# Patient Record
Sex: Female | Born: 1965 | Race: White | Hispanic: No | Marital: Married | Smoking: Never smoker
Health system: Western US, Academic
[De-identification: ages and names within clinical notes are randomized; demographics above are authoritative.]

## PROBLEM LIST (undated history)

## (undated) DIAGNOSIS — E663 Overweight: Secondary | ICD-10-CM

## (undated) HISTORY — DX: Overweight: E66.3

## (undated) HISTORY — PX: HYSTEROSCOPY: 56350

---

## 2010-07-12 HISTORY — PX: ENDOMETRIAL ABLATION: SHX621

## 2015-05-13 ENCOUNTER — Other Ambulatory Visit: Payer: Commercial Managed Care - PPO | Attending: Obstetrics & Gynecology | Admitting: Obstetrics & Gynecology

## 2015-05-13 ENCOUNTER — Encounter (INDEPENDENT_AMBULATORY_CARE_PROVIDER_SITE_OTHER): Payer: Self-pay | Admitting: Obstetrics & Gynecology

## 2015-05-13 ENCOUNTER — Encounter: Payer: Self-pay | Admitting: Hospital

## 2015-05-13 VITALS — BP 109/67 | HR 67 | Temp 98.1°F | Ht 63.0 in | Wt 150.0 lb

## 2015-05-13 DIAGNOSIS — Z0000 Encounter for general adult medical examination without abnormal findings: Secondary | ICD-10-CM

## 2015-05-13 DIAGNOSIS — Z01419 Encounter for gynecological examination (general) (routine) without abnormal findings: Secondary | ICD-10-CM

## 2015-05-13 DIAGNOSIS — Z1239 Encounter for other screening for malignant neoplasm of breast: Secondary | ICD-10-CM

## 2015-05-13 DIAGNOSIS — Z800 Family history of malignant neoplasm of digestive organs: Secondary | ICD-10-CM

## 2015-05-13 DIAGNOSIS — Z124 Encounter for screening for malignant neoplasm of cervix: Principal | ICD-10-CM | POA: Insufficient documentation

## 2015-05-13 DIAGNOSIS — N912 Amenorrhea, unspecified: Secondary | ICD-10-CM | POA: Insufficient documentation

## 2015-05-13 NOTE — Patient Instructions (Signed)
[facility]: ([place])--doctors taking new patients  [phone]    Dr. Farrel Connersourtney Hanson  Dr. Royetta AsalMelinda Chen

## 2015-05-13 NOTE — Progress Notes (Signed)
Well Woman Exam    Natalie StaplerKimberly Hizer is a 49 year old female who presents today for a well-woman exam. Today, she reports : recently moved to [place] from [place], older children (ages 7617 and 8820) both had issues with Lyme disease.     Frustrated with weight gain, trouble losing weight.  H/o endometrial ablation 2013, light menses after that until 1 yr ago, amenorrhea.  Not bothered by hot flashes, night sweats.    Menarche age 49, no h/o abnormal pap's or STDs    OB/GYN History:  No obstetric history on file.  Menses: see HPI  Sexually active: yes, married  History of abnormal pap smears-no  History of sexually transmitted infections-no  Last pap: 3 yrs ago  Last mammogram: due  Last colonoscopy: 3 yrs ago (CT)  Last dexa: n/a    Review of Systems  Constitutional: Negative  Eyes: Negative  ENT: Negative  Cardiac: Negative  Pulmonary: Negative  Gastrointestional: Negative  Musculoskeletal: Negative  Skin: Negative  Neurologic: Negative  Psychiatric: Negative  Endocrine: Hot flashes  Blood Disease: Negative  Allergy: Negative  OB/Gyn: Negative      Past medical, surgical, OB/GYN, family and social history reviewed today and updated in the EMR.    Past Medical History   Diagnosis Date    Overweight (BMI 25.0-29.9)        Past Surgical History   Procedure Laterality Date    Endometrial ablation  2012    Hysteroscopy       H/S myomectomy       Family History   Problem Relation Age of Onset    Colon Cancer Mother 4670    Lung Cancer Mother     Arthritis Mother     Osteoporosis Mother     Prostate Cancer Father 5970       Social History:  History   Smoking Status    Never Smoker   Smokeless Tobacco    Not on file       Social History     Social History    Marital status: Married     Spouse name: N/A    Number of children: N/A    Years of education: N/A     Occupational History    Not on file.     Social History Main Topics    Smoking status: Never Smoker    Smokeless tobacco: Not on file    Alcohol use 70.0 oz/week        14 Glasses of wine per week    Drug use: No    Sexual activity: Yes     Partners: Male     Birth control/ protection: Surgical      Comment: vasectomy     Other Topics Concern    Not on file     Social History Narrative    Moved from [place].     2 children 17 and 20.               Allergies:  Review of patient's allergies indicates no known allergies.    Current medications:  See EMR         PHYSICAL EXAMINATION:  BP 109/67  Pulse 67  Temp 98.1 F (36.7 C) (Oral)  Ht 5\' 3"  (1.6 m)  Wt 68 kg (150 lb)  BMI 26.57 kg/m2   General Appearance: in no apparent distress, well developed and well nourished and non-toxic  Breast: normal and no lymphadenopathy, skin changes, masses or discharge  Abdomen: Abdomen soft, non-tender. No masses  Extremities, Peripheral Vascular: Normal  Pelvic Exam:  Vulva: Normal external genitalia and Bartholin's glands, urethra, Skene's glands negative  Vagina: Normal mucosa, no discharge.  Cervix: Parous, closed, mobile, no discharge.   Uterus: Normal shape, position and consistency  Adnexa: No masses, nodularity, tenderness  Rectal: Not examined    Assessment and plan:  Well woman exam: HCM: Pap done with HPV co-testing.  Mammogram ordered.  Referral to Medicine to establish care, bring last colonoscopy records to determine interval for screening, +fam hx colon [place].  Hormone testing, menopause likely  Weight gain- discussed diet/exercise for weight loss, check TSH.    - discussed diet, exercise, colonoscopy, mammography, self breast exam, weight loss  All questions were answered   Return to clinic for annual exam in one year or sooner prn          Orders Placed This Encounter   Procedures    Mychart Access Code Procedure    Screening Mammogram Bilateral    Cytopath Gyn (Pap Smear)    Estradiol, Blood Yellow serum separator tube    Follicle Stimulating Hormone, Blood Green Plasma Separator Tube    TSH, Blood Green Plasma Separator Tube    [facility]        Benny Lennert [place]  [id number]

## 2015-05-16 NOTE — Procedures (Signed)
SPECIMENS SUBMITTED:  Cervical (Pap) Smear;Thin-Prep Vial Received    CLINICAL INFORMATION:   No LMP Given; Other Clinical Findings: Screening  FINAL CYTOLOGIC INTERPRETATION:  No Atypical or Malignant Cells    COMMENT:  HPV Subtyping will be performed on excess Thin-Prep collection fluid from  the specimen vial.  Result will be reported in EPIC separately.    SPECIMEN ADEQUACY:  Satisfactory for evaluation  The Pap smear is a screening test with an inherent low error rate. Although  a normal [negative] result is highly predictive of the absence of cervical  cancer and its precursor lesions, it does not exclude the presence of  significant disease. Results must be evaluated within the context of the  individual patient.  CONFIDENTIAL HEALTH INFORMATION: Health Care information is personal and  sensitive information. If it is being faxed to you it is done so under  appropriate authorization from the patient or under circumstances that do  not require patient authorization. You, the recipient, are obligated to  maintain it in a safe, secure and confidential manner. Re-disclosure  without additional patient consent or as permitted by law is prohibited.  Unauthorized re-disclosure or failure to maintain confidentiality could  subject you to penalties described in federal and state law.  If you have  received this report or facsimile in error, please notify the [facility] immediately and destroy the received document(s).    Material reviewed and Interpreted and  Report Electronically Signed by:  Rennis PettyMarina Sergeev CT (ASCP)  CT(ASCP)  05/16/15 13:31  Electronic Signature derived from a single  controlled access password

## 2015-05-20 LAB — HPV HIGH RISK DNA PROBE, FEMALE
HPV High Risk Genotype 16: NEGATIVE
HPV High Risk Genotype 18: NEGATIVE
HPV Other High Risk Genotypes (Not type 16 or 18): NEGATIVE

## 2018-04-24 ENCOUNTER — Encounter (INDEPENDENT_AMBULATORY_CARE_PROVIDER_SITE_OTHER): Payer: Self-pay | Admitting: Obstetrics & Gynecology

## 2018-04-25 NOTE — Telephone Encounter (Signed)
From: Loretha Stapler  To: Gwenevere Abbot, MD  Sent: 04/24/2018 4:57 PM PDT  Subject: 20-Other    I would like to schedule an appointment with Dr. Laurence Ferrari for my routine gyn exam.

## 2018-05-04 ENCOUNTER — Other Ambulatory Visit: Payer: Self-pay | Attending: Women's Health | Admitting: Women's Health

## 2018-05-04 ENCOUNTER — Encounter (INDEPENDENT_AMBULATORY_CARE_PROVIDER_SITE_OTHER): Payer: Self-pay | Admitting: Women's Health

## 2018-05-04 VITALS — BP 104/68 | HR 62 | Temp 97.9°F | Ht 63.0 in | Wt 150.0 lb

## 2018-05-04 DIAGNOSIS — Z01419 Encounter for gynecological examination (general) (routine) without abnormal findings: Secondary | ICD-10-CM | POA: Insufficient documentation

## 2018-05-04 MED ORDER — ESTROGENS CONJUGATED 0.3 MG OR TABS: 0.30 mg | ORAL_TABLET | Freq: Every day | ORAL | Status: AC

## 2018-05-04 MED ORDER — PROGESTERONE MICRONIZED 200 MG OR CAPS: 200.00 mg | ORAL_CAPSULE | Freq: Every day | ORAL | Status: AC

## 2018-05-04 NOTE — Patient Instructions (Signed)
We will contact you with results in about 3-4 weeks.   There are new pap smear guidelines (cervical cancer screening) from the American College of Obstetricians and Gynecologists. These guidelines are based on extensive scientific evidence.   Start screening at age 54 regardless of sexual activity. However, if sexually active, patients should also have screening for sexually transmitted infections.     Age 54 - 29: recommend pap smear every 3 years. We also recommend an annual breast and pelvic exam and to address contraception and screening for sexually transmitted infections.     Age 30 - 65: recommend pap smear every 3 years UNLESS you have a history of being treated for a precancer of the cervix. We recommend an annual breast and pelvic exam and medication review, and management of contraception for premenopausal women.     HPV (human papilloma virus) screening is optional, and should be done only in women over 30. If you have not had any abnormal pap smears and you are negative for high risk HPV, you can increase your pap smear interval to 5 years.     Stop pap smears at age 65 if the last 3 tests have been normal, and no abnormal tests in the past 10 years. Continue your annual breast and pelvic exam.   Women who have had their cervix removed (with hysterectomy) no longer need to have pap smears done, unless they have been treated for cervical cancer or high grade dysplasia in the past.     Any woman with a history of CIN 2 or 3 (high grade dysplasia) or cervical cancer should have an annual pap for 20 years from the time of treatment forward.   If you have any questions about these guidelines, particularly how they relate to what is recommended for you, please ask me.    I am happy to communicate with you using [facility]'s new [place] for patients.   This is a secure way for you to view your electronic medical record and send simple, non-urgent messages to any of your healthcare providers.   I have given you  an activation code for MyChart. If you would like to sign up for MyChart access, you will need to go to the following website and signup as a new user with the activation code.   [email or web address]   If you have questions, you can call Customer Support at [phone]

## 2018-05-05 NOTE — Progress Notes (Signed)
Well Woman Exam    Natalie Todd is a 52 year old female who presents today for a well-woman exam. Today, she reports   That she is here for [place].  She is well- no real concerns today.  She is currently managing her menopause   symptions with HRT.   She will have her mammogram this month.     Past Medical History:   Diagnosis Date   . Overweight (BMI 25.0-29.9)        Past Surgical History:   Procedure Laterality Date   . ENDOMETRIAL ABLATION  2012   . HYSTEROSCOPY      H/S myomectomy       Family History   Problem Relation Name Age of Onset   . Colon Cancer Mother  63   . Lung Cancer Mother     . Arthritis Mother     . Osteoporosis Mother     . Prostate Cancer Father  38       Social History:  Social History     Tobacco Use   Smoking Status Never Smoker   Smokeless Tobacco Never Used       Social History     Substance and Sexual Activity   Alcohol Use Yes   . Alcohol/week: 116.7 standard drinks   . Types: 14 Glasses of wine per week           Allergies:  Patient has no known allergies.    Current medications:  See EMR     OB/GYN History:  [id number]  Menses: several years ago   Sexually active: with her husband  No history of abnormal pap smears  No history of sexually transmitted infections  Last pap: 2016  Last mammogram: 2017  Last colonoscopy: na  Last dexa: na    Review of Systems  Constitutional: Negative  Eyes: Negative  ENT: Negative  Cardiac: Negative  Pulmonary: Negative  Gastrointestional: Negative  Musculoskeletal: Negative  Skin: Negative  Neurologic: Negative  Psychiatric: Negative  Endocrine: Negative  Blood Disease: Negative  Allergy: Negative  OB/Gyn: Negative      PHYSICAL EXAMINATION:  BP 104/68 (BP Location: Left arm, BP Patient Position: Sitting, BP cuff size: Regular)   Pulse 62   Temp 97.9 F (36.6 C) (Oral)   Ht 5\' 3"  (1.6 m)   Wt 68 kg (150 lb)   BMI 26.57 kg/m      General Appearance: well-appearing, alert, cooperative; NAD  HEENT: Normocephalic. Sclera anicteric. Extraocular  muscles intact.  Mouth: negative exudate or erythema  Skin: Warm, dry and no rashes or lesions.  Neck: supple; negative adenopathy. thyroid normal size and consistency, non-tender, no nodules palpated no JVD  Heart: regular rate and rhythm  Lungs: clear to auscultation  Breast: no lymphadenopathy, skin changes, masses or discharge  Abdomen: Abdomen soft, non-tender. BS normal. No masses, organomegaly +BS soft. NT/ND. no hepatosplenomegaly   Extremities, Peripheral Vascular: No edema peripheral pulses+ DP/PT    Pelvic Exam:  Vulva: Normal external genitalia and Bartholin's glands, urethra, Skene's glands negative  Urethra: normal and normal urethral meatus  Bladder: normal urethral meatus and normal urethra  Vagina: Normal mucosa, no discharge   Cervix: Parous, closed, mobile, no discharge  Uterus: normal size, shape, contour  Adnexa: no tenderness  Anus/Perineum: normal appearing, no lesions  Rectal: deferred      Assessment and plan:  Normal well woman exam  - Mammogram ordered  - pap collected and guidelines reviewed  - discussed diet, exercise, mammography,  self breast exam, vulvar care  All questions answered   RTC 1 year and PRN       Orders Placed This Encounter   Procedures   . Screening Mammogram With Digital Breast Tomosynthesis - Bilateral   . Cytopath Gyn (Pap Smear)         Arvilla Meres [facility]

## 2018-05-12 LAB — HPV HIGH RISK DNA PROBE, FEMALE
HPV High Risk Genotype 16: NOT DETECTED
HPV High Risk Genotype 18: NOT DETECTED
HPV Other High Risk Genotypes (Not type 16 or 18): NOT DETECTED

## 2018-05-15 ENCOUNTER — Encounter (INDEPENDENT_AMBULATORY_CARE_PROVIDER_SITE_OTHER): Payer: Self-pay | Admitting: Women's Health

## 2018-06-26 ENCOUNTER — Ambulatory Visit (HOSPITAL_BASED_OUTPATIENT_CLINIC_OR_DEPARTMENT_OTHER): Payer: Self-pay

## 2018-07-24 ENCOUNTER — Other Ambulatory Visit: Payer: Self-pay

## 2018-07-24 ENCOUNTER — Ambulatory Visit
Admission: RE | Admit: 2018-07-24 | Discharge: 2018-07-24 | Disposition: A | Discharge status: Home / Self Care | Payer: BC Managed Care – PPO | Attending: Diagnostic Radiology | Admitting: Diagnostic Radiology

## 2018-07-24 DIAGNOSIS — R928 Other abnormal and inconclusive findings on diagnostic imaging of breast: Secondary | ICD-10-CM | POA: Insufficient documentation

## 2018-07-24 DIAGNOSIS — Z01419 Encounter for gynecological examination (general) (routine) without abnormal findings: Secondary | ICD-10-CM

## 2018-07-24 DIAGNOSIS — Z1231 Encounter for screening mammogram for malignant neoplasm of breast: Secondary | ICD-10-CM | POA: Insufficient documentation

## 2018-07-25 ENCOUNTER — Encounter (HOSPITAL_BASED_OUTPATIENT_CLINIC_OR_DEPARTMENT_OTHER): Payer: Self-pay | Admitting: Women's Health

## 2018-07-25 ENCOUNTER — Encounter (INDEPENDENT_AMBULATORY_CARE_PROVIDER_SITE_OTHER): Payer: Self-pay | Admitting: Women's Health

## 2018-07-25 DIAGNOSIS — R928 Other abnormal and inconclusive findings on diagnostic imaging of breast: Principal | ICD-10-CM

## 2018-07-25 DIAGNOSIS — Z872 Personal history of diseases of the skin and subcutaneous tissue: Principal | ICD-10-CM

## 2018-07-25 NOTE — Telephone Encounter (Signed)
From: Loretha Stapler  To: Tor Netters, NP  Sent: 07/25/2018 12:21 PM PST  Subject: 2-Procedural Question    Minerva Areola,  Thank you for sending my results of my mammogram. Can you order a follow up ultrasound?  Thank you.  Selena Batten

## 2018-07-26 NOTE — Telephone Encounter (Signed)
Order signed.     Thank you,    Marly Schuld WHNP

## 2018-07-27 ENCOUNTER — Other Ambulatory Visit (INDEPENDENT_AMBULATORY_CARE_PROVIDER_SITE_OTHER): Payer: BC Managed Care – PPO

## 2018-07-27 DIAGNOSIS — R928 Other abnormal and inconclusive findings on diagnostic imaging of breast: Principal | ICD-10-CM

## 2018-07-27 DIAGNOSIS — N6322 Unspecified lump in the left breast, upper inner quadrant: Secondary | ICD-10-CM

## 2018-07-27 DIAGNOSIS — N6321 Unspecified lump in the left breast, upper outer quadrant: Secondary | ICD-10-CM

## 2018-07-28 ENCOUNTER — Encounter (INDEPENDENT_AMBULATORY_CARE_PROVIDER_SITE_OTHER): Payer: Self-pay | Admitting: Hospital

## 2018-07-28 ENCOUNTER — Telehealth (INDEPENDENT_AMBULATORY_CARE_PROVIDER_SITE_OTHER): Payer: Self-pay | Admitting: Women's Health

## 2018-07-28 NOTE — Telephone Encounter (Signed)
Please let pt know that sono showed    "IMPRESSION / RECOMMENDATION:  Normal appearing tissue in the left breast is benign. Findings and recommendations were discussed with the patient at the time of image interpretation.    A return to screening in 1 year is recommended.    ASSESSMENT:  BI-RADS Category 2:  Benign Finding(s)      Thank you,    Arvilla Meres [facility]

## 2018-07-28 NOTE — Telephone Encounter (Signed)
error 

## 2018-07-28 NOTE — Telephone Encounter (Signed)
Left message for pt to return call to clinic.  Sent Mychart message.

## 2019-07-23 ENCOUNTER — Other Ambulatory Visit: Payer: Self-pay | Admitting: Women's Health

## 2019-07-23 DIAGNOSIS — Z1231 Encounter for screening mammogram for malignant neoplasm of breast: Secondary | ICD-10-CM

## 2019-07-27 ENCOUNTER — Encounter: Payer: Self-pay | Admitting: Hospital

## 2019-07-31 ENCOUNTER — Other Ambulatory Visit (INDEPENDENT_AMBULATORY_CARE_PROVIDER_SITE_OTHER): Payer: BC Managed Care – PPO

## 2019-07-31 ENCOUNTER — Encounter (INDEPENDENT_AMBULATORY_CARE_PROVIDER_SITE_OTHER): Payer: Self-pay | Admitting: Women's Health

## 2019-07-31 DIAGNOSIS — Z1231 Encounter for screening mammogram for malignant neoplasm of breast: Secondary | ICD-10-CM

## 2019-10-09 ENCOUNTER — Encounter (INDEPENDENT_AMBULATORY_CARE_PROVIDER_SITE_OTHER): Payer: Self-pay

## 2019-10-09 ENCOUNTER — Encounter (INDEPENDENT_AMBULATORY_CARE_PROVIDER_SITE_OTHER): Payer: Self-pay | Admitting: Hospital

## 2020-08-18 ENCOUNTER — Other Ambulatory Visit: Payer: Self-pay | Admitting: Women's Health

## 2020-08-18 DIAGNOSIS — Z1231 Encounter for screening mammogram for malignant neoplasm of breast: Secondary | ICD-10-CM

## 2020-08-20 ENCOUNTER — Ambulatory Visit (HOSPITAL_BASED_OUTPATIENT_CLINIC_OR_DEPARTMENT_OTHER): Payer: BC Managed Care – PPO

## 2020-08-28 ENCOUNTER — Other Ambulatory Visit: Payer: Self-pay

## 2020-08-28 ENCOUNTER — Ambulatory Visit
Admission: RE | Admit: 2020-08-28 | Discharge: 2020-08-28 | Disposition: A | Discharge status: Home / Self Care | Payer: BC Managed Care – PPO | Attending: Women's Health | Admitting: Women's Health

## 2020-08-28 DIAGNOSIS — Z1231 Encounter for screening mammogram for malignant neoplasm of breast: Secondary | ICD-10-CM | POA: Insufficient documentation

## 2020-08-29 ENCOUNTER — Encounter (INDEPENDENT_AMBULATORY_CARE_PROVIDER_SITE_OTHER): Payer: Self-pay | Admitting: Women's Health

## 2021-09-04 IMAGING — MG MAMMOGRAPHY SCREENING BILATERAL 3[PERSON_NAME]
8 series · 9 of 24 positions shown · non-contrast
Comparison: None.

________________________________________________________________________________________________ 
MAMMOGRAPHY SCREENING BILATERAL 3MITHAT CAN KIMYA, 09/04/2021 [DATE]: 
CLINICAL INDICATION: Screening.
TECHNIQUE: Digital bilateral mammograms and 3-D Tomosynthesis were obtained. 
These were interpreted both primarily and with the aid of computer-aided 
detection system.  
BREAST DENSITY: (Level D) The breasts are extremely dense, which lowers the 
sensitivity of mammography.

[R MLO]
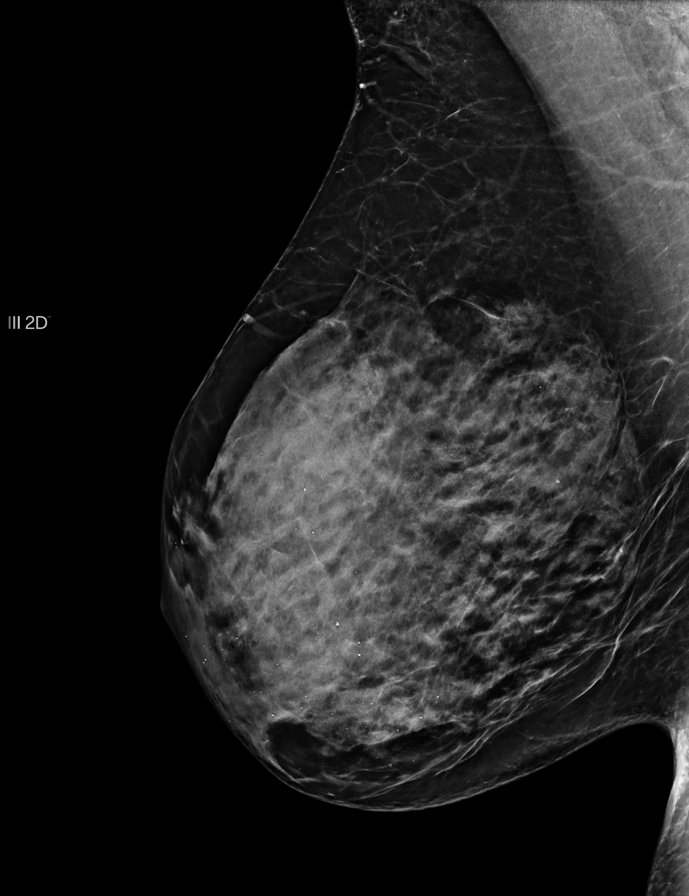

[R CC]
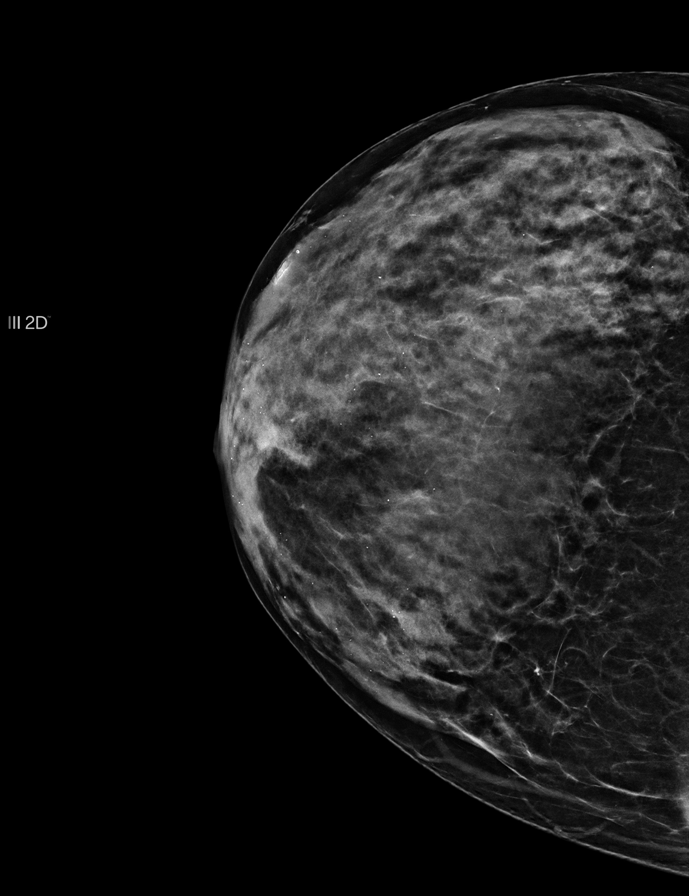

[L MLO]
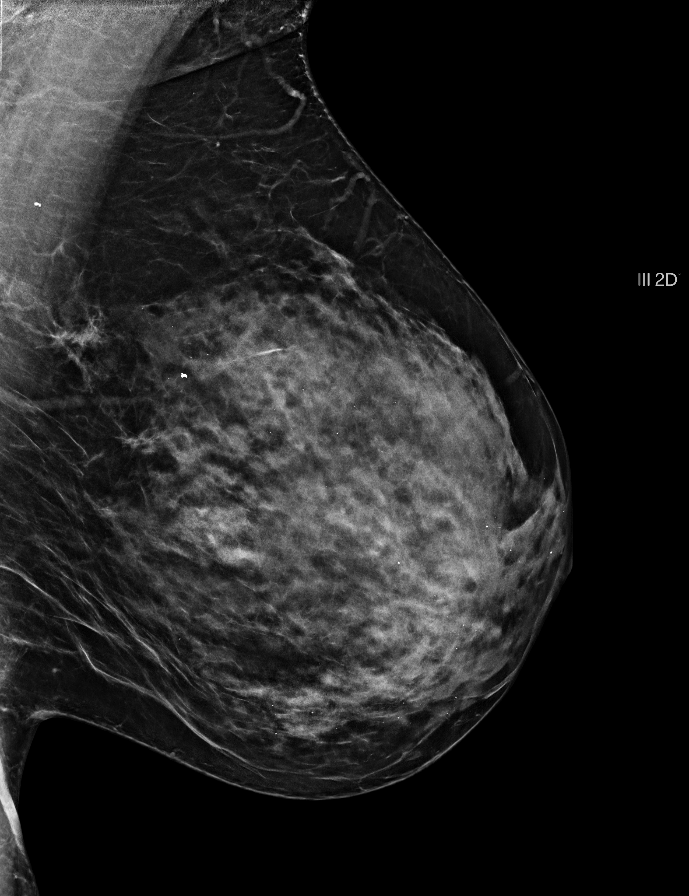

[L CC]
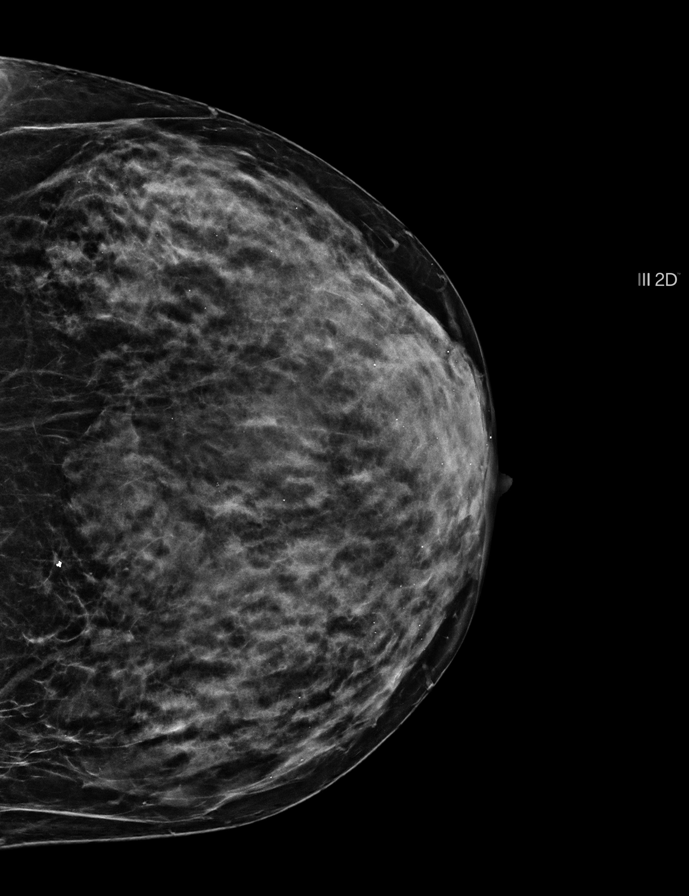

[L MLO tomo · 2 of 69 frames shown]
[frame 23/69]
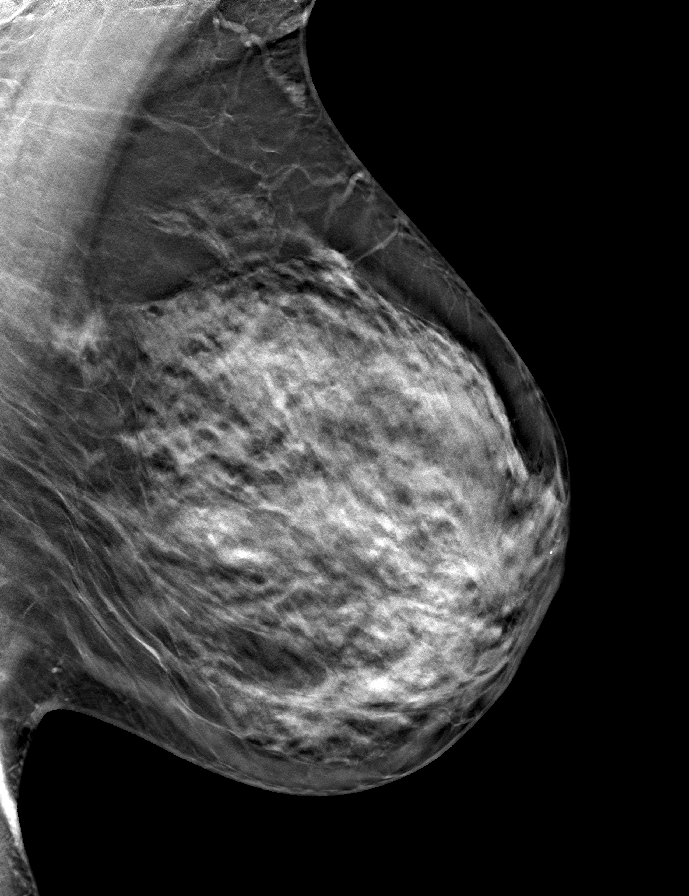
[frame 35/69]
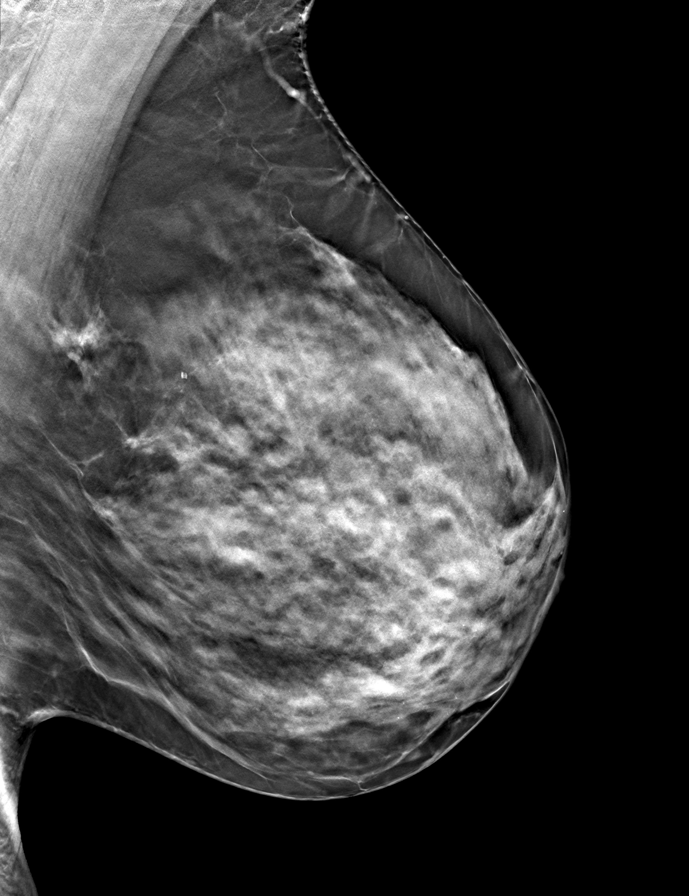

[L CC tomo · tomo slice 27/54.0]
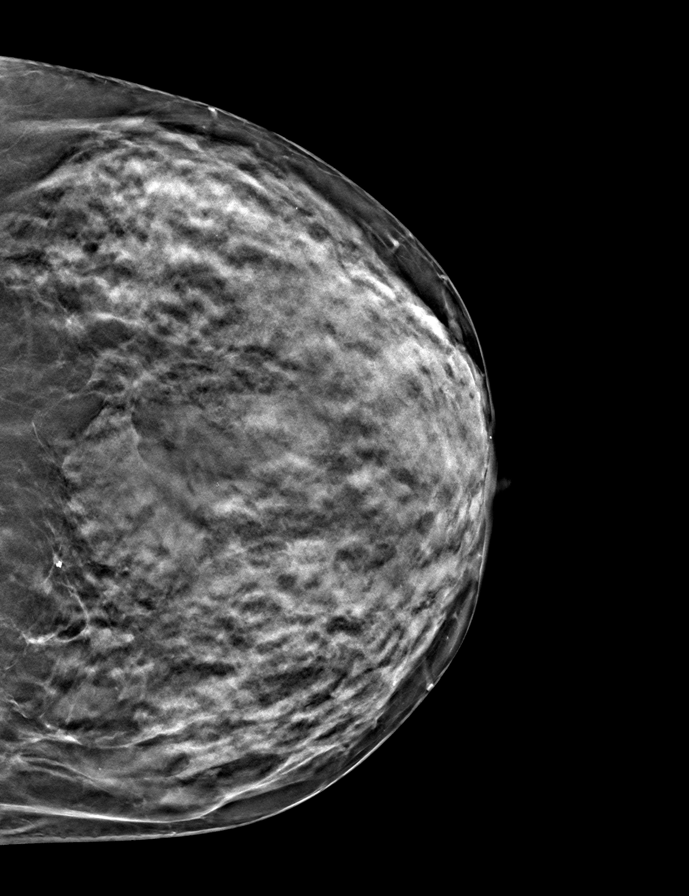

[R MLO tomo · tomo slice 34/67.0]
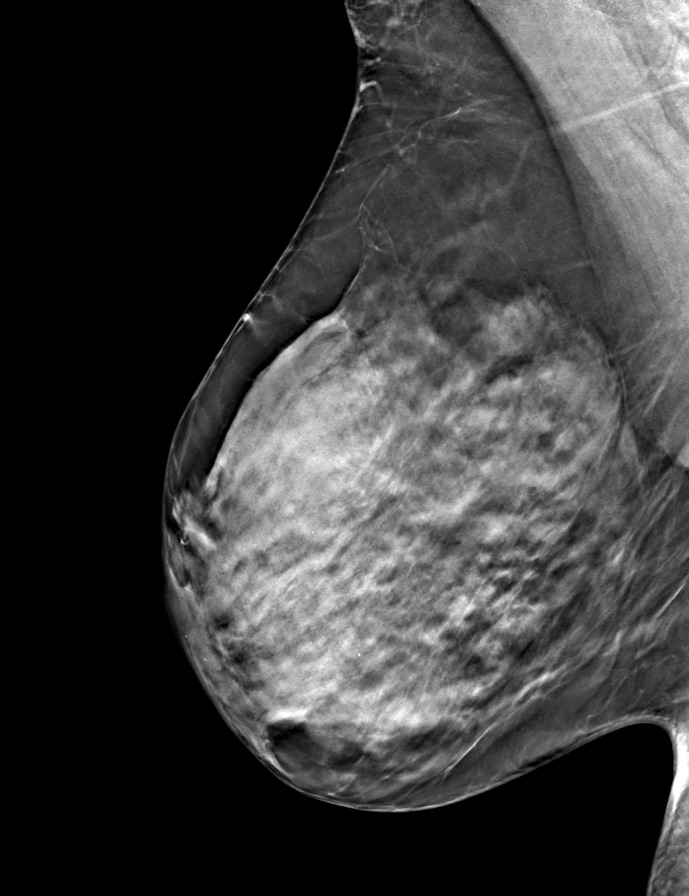

[R CC tomo · tomo slice 27/52.0]
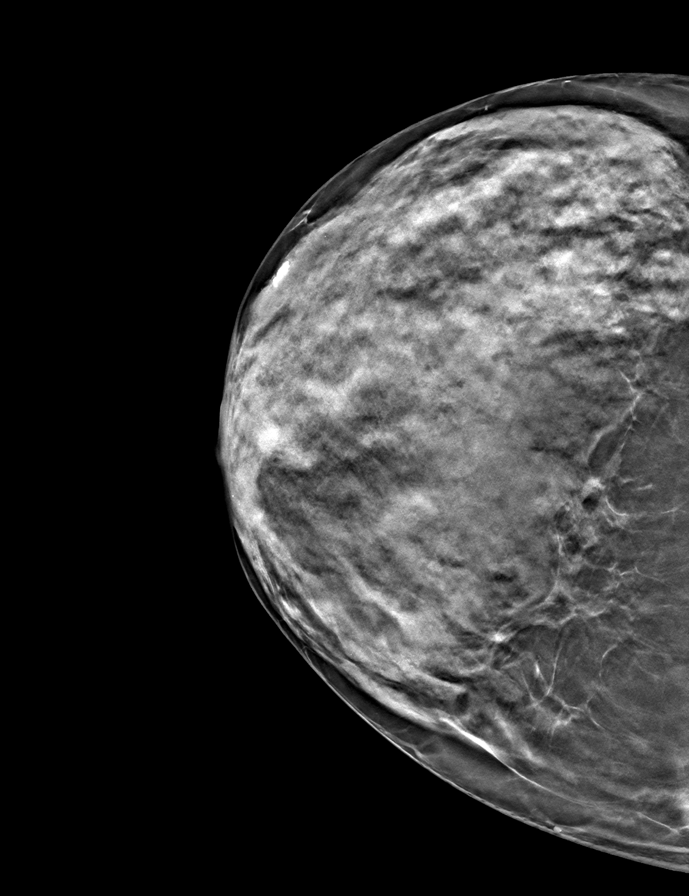

[9 of 24 positions shown; findings below may reference images not displayed]

FINDINGS: Benign calcification. Marker clip left breast. No suspicious mass, 
calcifications, or area of architectural distortion in either breast.
IMPRESSION: No dominant mass or suspicious calcification. Exam is limited given the lack of 
prior mammograms available for comparison. 
(BI-RADS 2) Benign findings. Routine mammographic follow-up is recommended.

## 2022-09-06 IMAGING — MG MAMMOGRAPHY SCREENING BILATERAL 3[PERSON_NAME]
8 series · 8 of 24 positions shown · non-contrast
Comparison: 09/04/2021

________________________________________________________________________________________________ 
MAMMOGRAPHY SCREENING BILATERAL 3JANIE PRUDHOMME, 09/06/2022 [DATE]: 
CLINICAL INDICATION: Encounter for screening mammogram.
TECHNIQUE: Digital bilateral mammograms and 3-D Tomosynthesis were obtained. 
These were interpreted both primarily and with the aid of computer-aided 
detection system.  
BREAST DENSITY: (Level D) The breasts are extremely dense, which lowers the 
sensitivity of mammography.

[L CC]
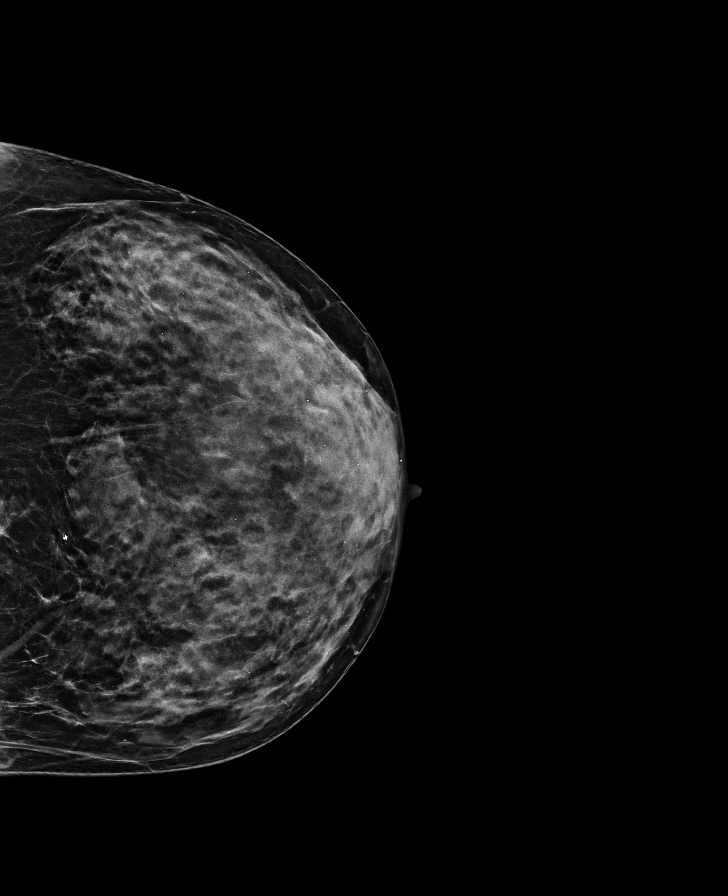

[R CC]
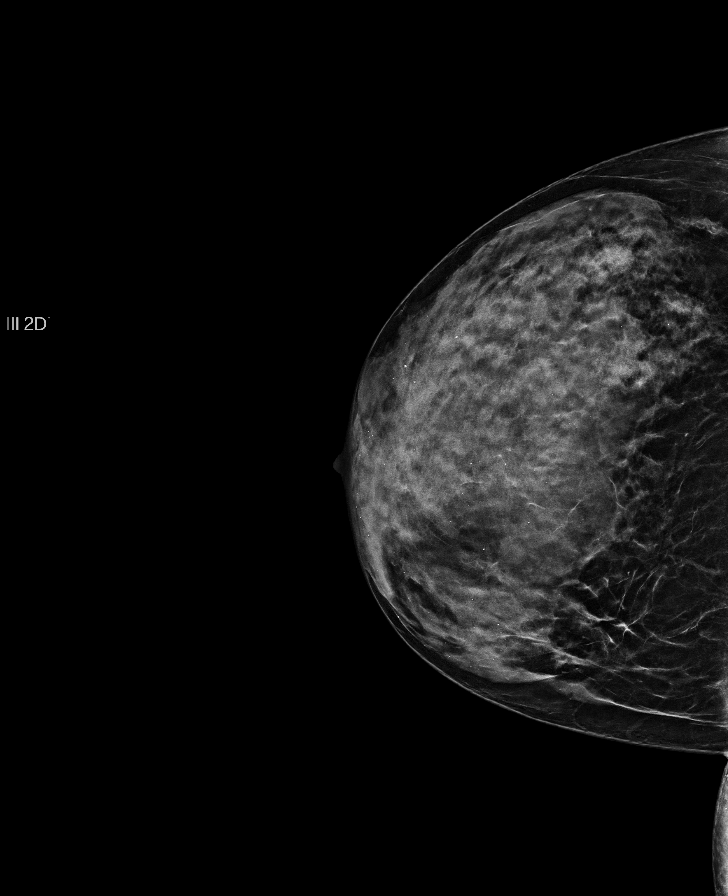

[R MLO]
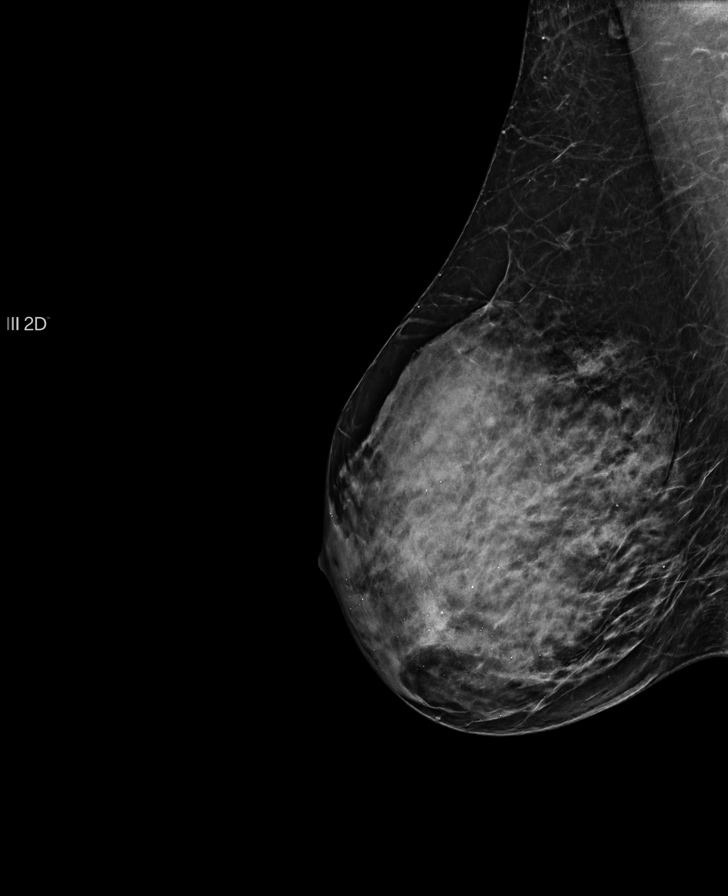

[L MLO]
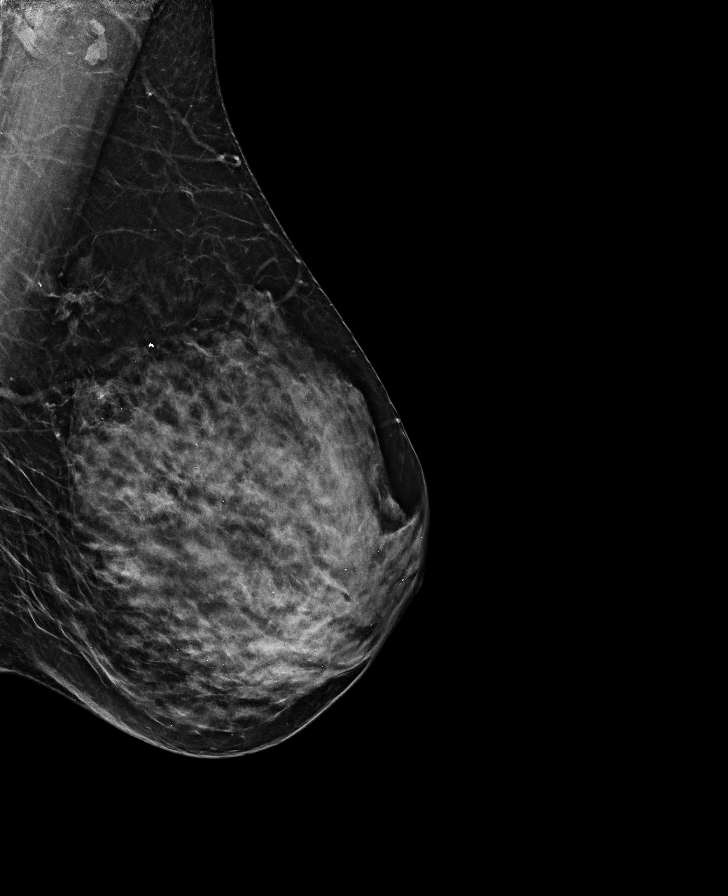

[R CC tomo · tomo slice 31/61.0]
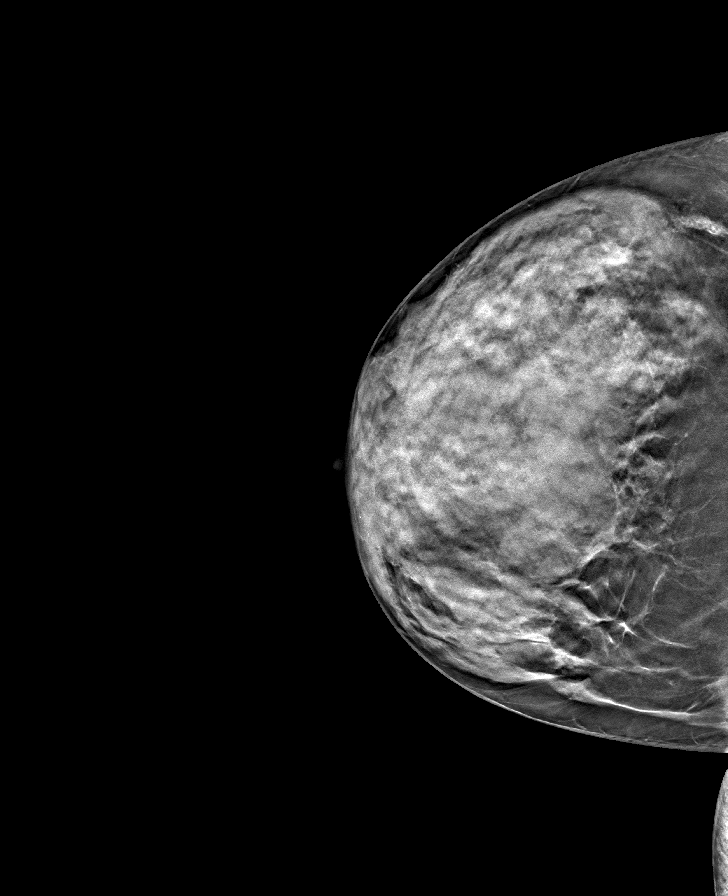

[L CC tomo · tomo slice 29/58.0]
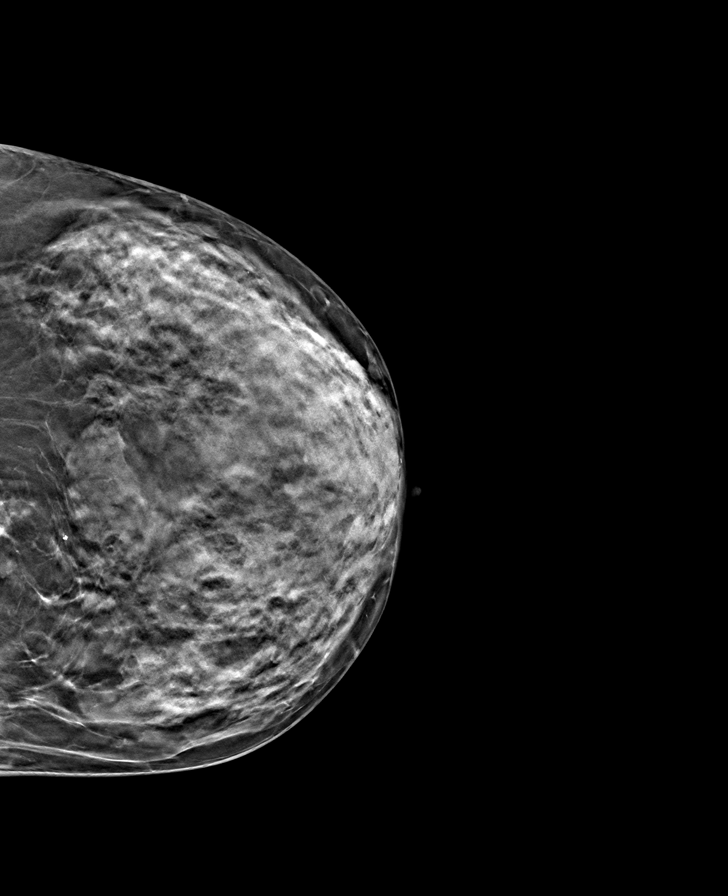

[R MLO tomo · tomo slice 35/69.0]
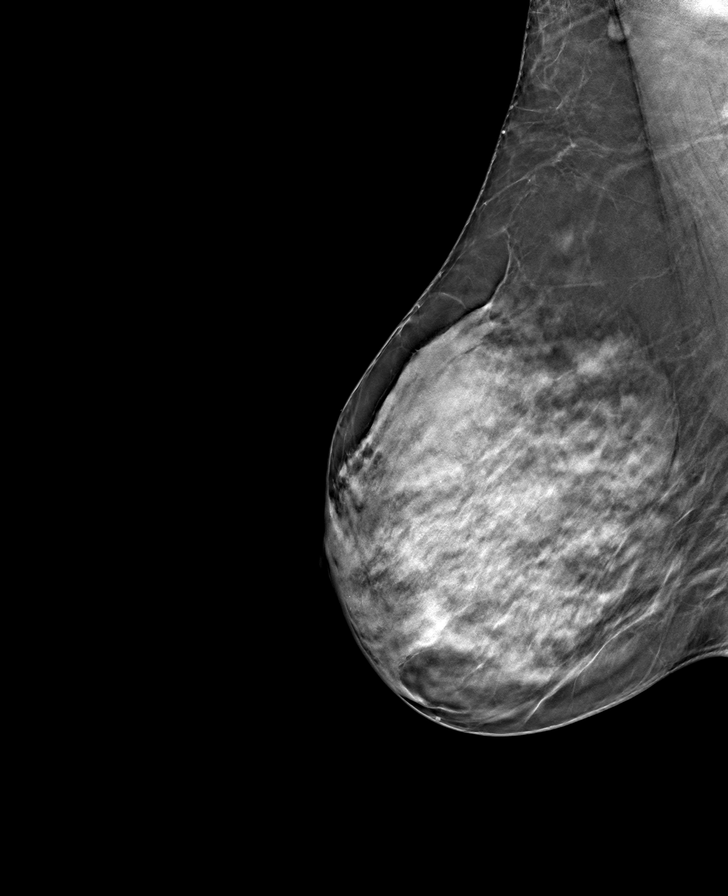

[L MLO tomo · tomo slice 35/69.0]
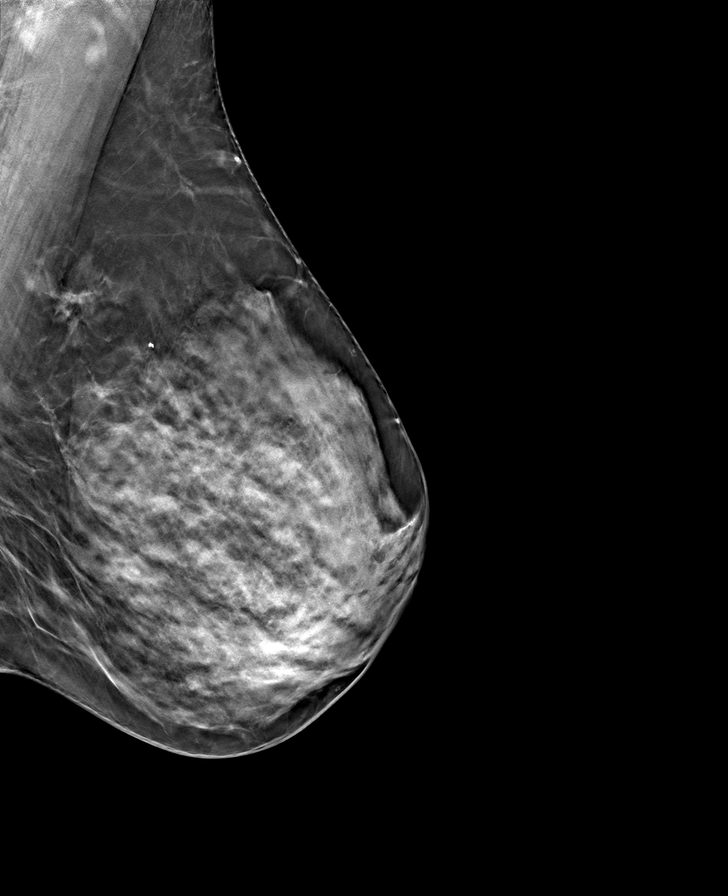

[8 of 24 positions shown; findings below may reference images not displayed]

FINDINGS: No suspicious mass, calcifications, or area of architectural 
distortion in either breast. Postbiopsy tissue marker left breast is again seen 
with some postbiopsy scarring. Overall stable mammographic evaluation.
IMPRESSION: No mammographic findings suggestive for malignancy. 
(BI-RADS 2) Benign findings. Routine mammographic follow-up is recommended.

## 2022-11-24 IMAGING — MR MRI BREAST BILATERAL W/WO CONTRAST
6 of 15 series · 19 of 48 positions shown · IV contrast (gadolinium)
Comparison: 11/16/2022 and exams dating back to 09/04/2021.

________________________________________________________________________________________________ 
MRI BREAST BILATERAL W/WO CONTRAST, 11/24/2022 [DATE]: 
CLINICAL INDICATION: Lesion found in prior [HOSPITAL] [DATE] positioning.
TECHNIQUE: Multiple sequences were obtained in various planes with both before 
and after the intravenous administration of gadolinium. In addition to the 
routine images, three-dimensional renderings were performed on an independent 
workstation, time activity curves generated over areas of enhancement and 
computer-aided detection utilized. 6.0 mL of Gadavist were injected 
intravenously. 1.5 mL was discarded. Patient was scanned on a 1.5T magnet.

[Series 301: survey · axial · 10.0mm · 1.76mm/px · 1 of 35 slices shown]
[im 1/35]
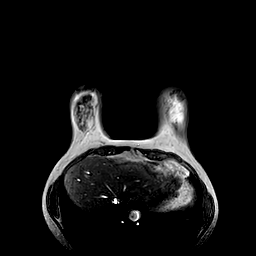

[Series 401: t1w_ffe_3d_cs · axial · 1.6mm · 0.57mm/px · z∈[-128,+71]mm · 5 of 250 slices shown]
[im 1/250]
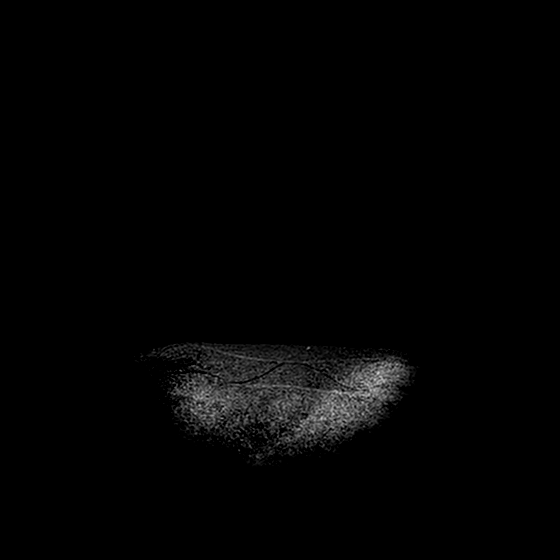
[im 63/250]
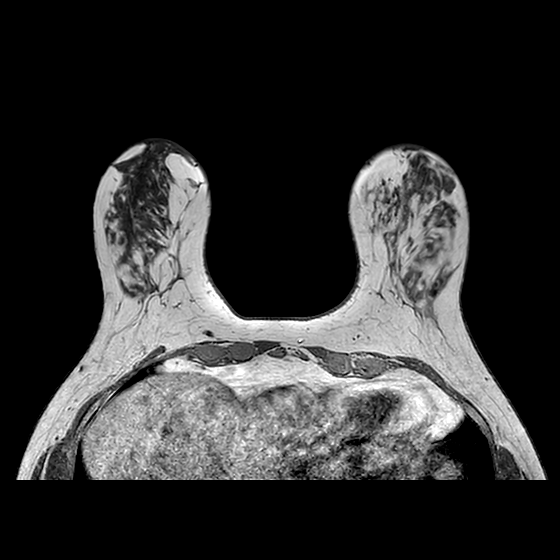
[im 125/250]
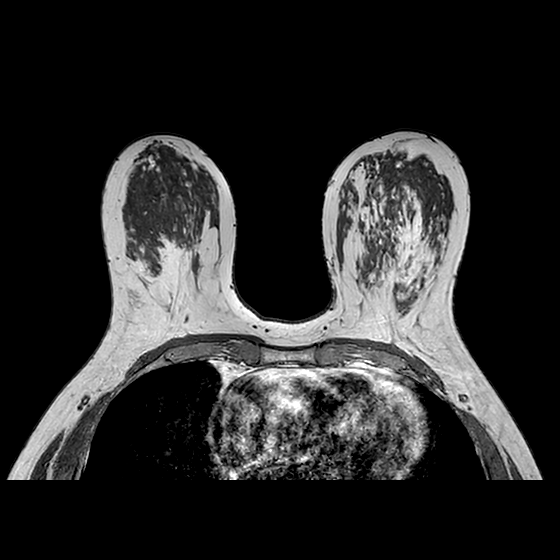
[im 187/250]
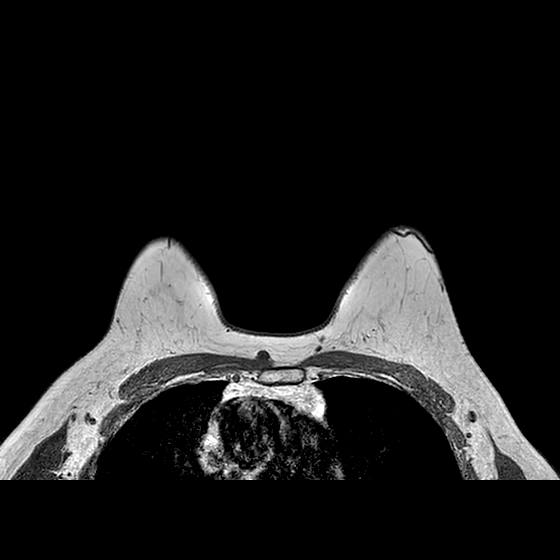
[im 250/250]
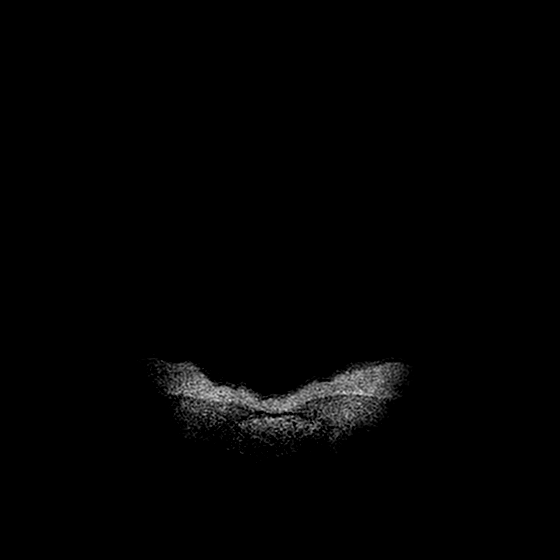

[Series 501: 3d_[id number] · axial · 2.5mm · 0.63mm/px · z∈[-128,+71]mm · 3 of 154 slices shown]
[im 1/154]
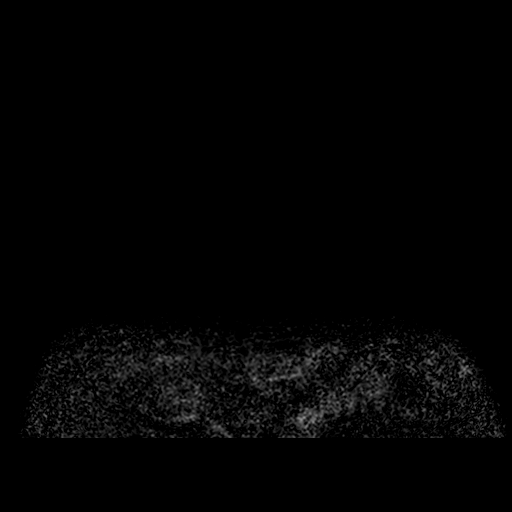
[im 77/154]
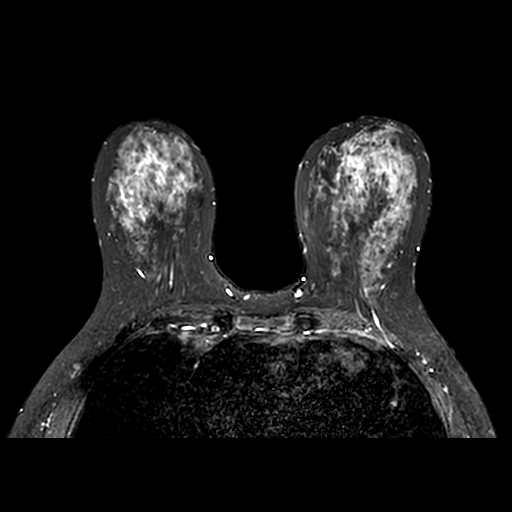
[im 154/154]
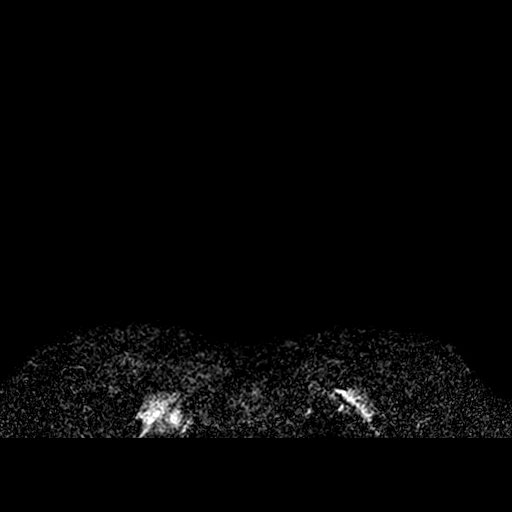

[Series 602: T1 dynamic fat-sat · axial · 2.0mm · 0.63mm/px · z∈[-128,+71]mm · 4 of 200 slices shown]
[im 1/200]
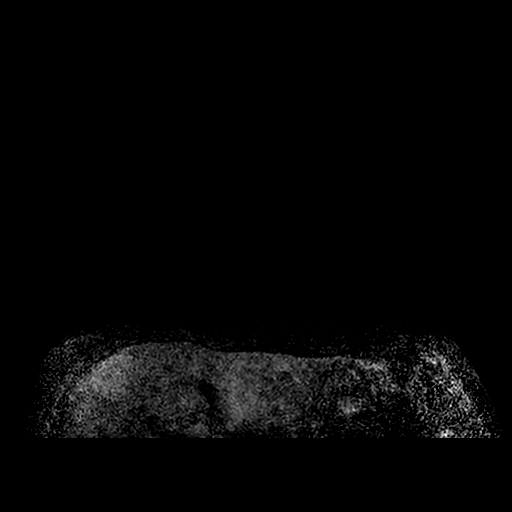
[im 67/200]
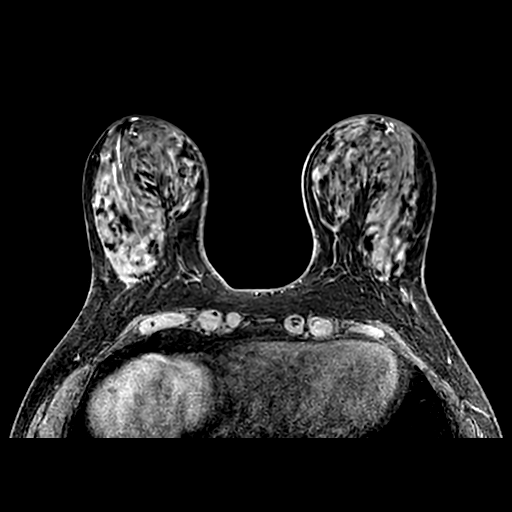
[im 133/200]
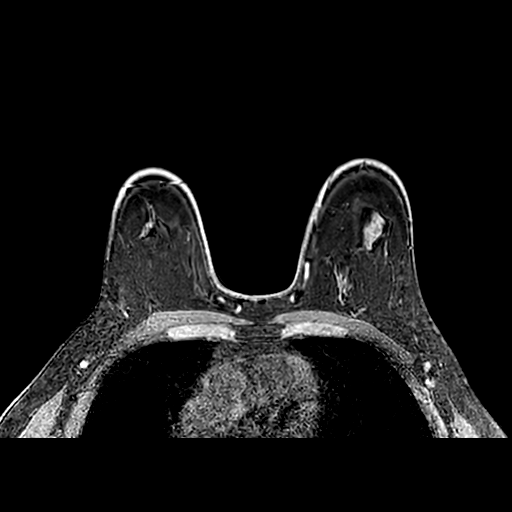
[im 200/200]
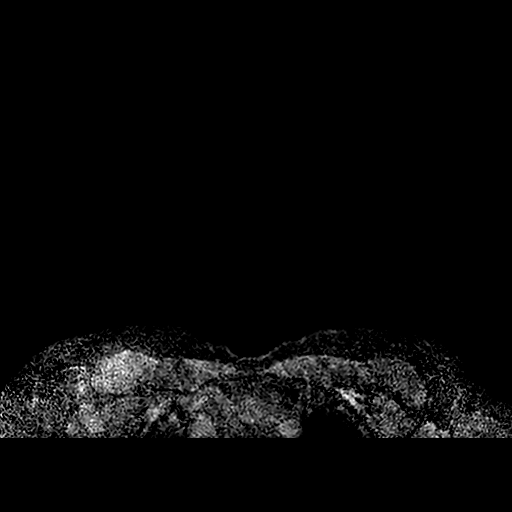

[Series 603: T1 dynamic fat-sat post-contrast · axial · 2.0mm · 0.63mm/px · z∈[-128,+71]mm · 4 of 200 slices shown (1 of 2)]
[im 1/200]
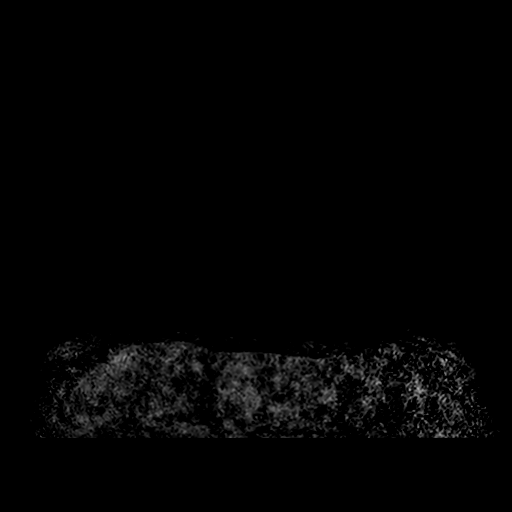
[im 67/200]
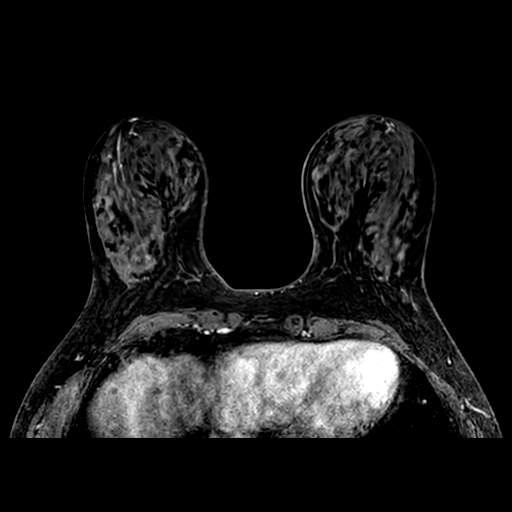
[im 133/200]
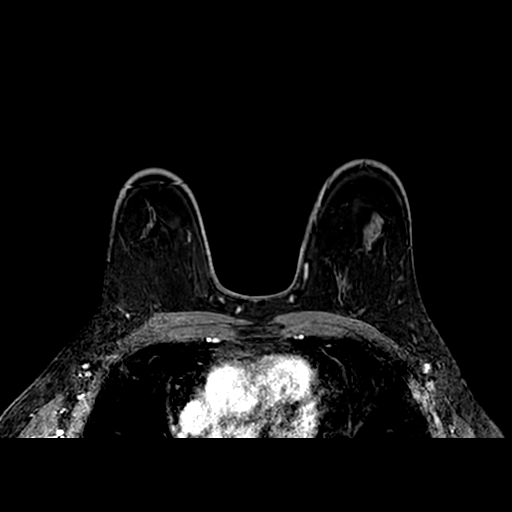
[im 200/200]
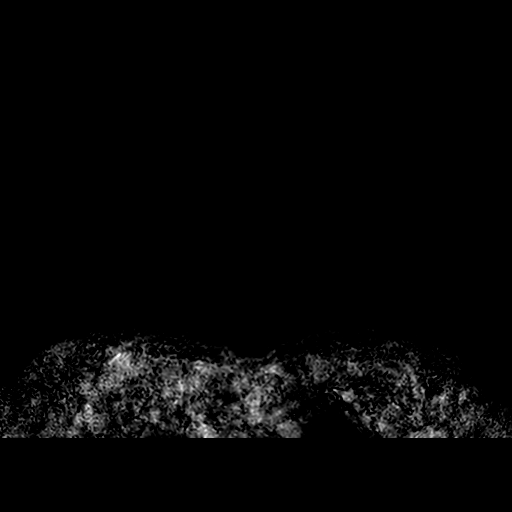

[Series 604: T1 dynamic fat-sat post-contrast · axial · 2.0mm · 0.63mm/px · z∈[-128,-62]mm · 2 of 200 slices shown (2 of 2)]
[im 1/200]
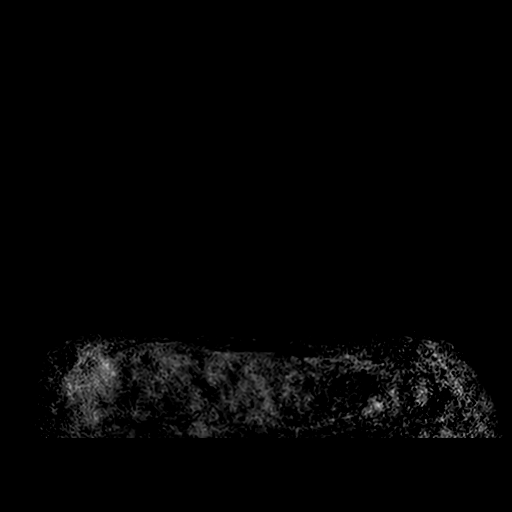
[im 67/200]
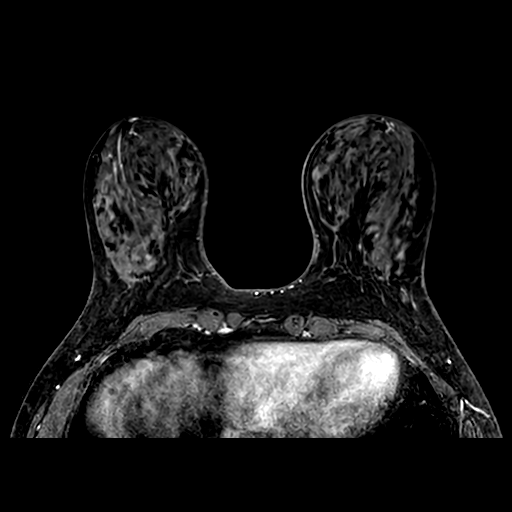

[19 of 48 positions shown; findings below may reference images not displayed]

FINDINGS: Minimal background parenchymal enhancement. Extremely dense breast 
parenchyma. 
Right breast: No abnormal areas of enhancement. No areas of enhancement meeting 
threshold criteria on CAD analysis. Small nonspecific right axillary lymph 
nodes.  No internal mammary nodes. 
Left breast: No abnormal areas of enhancement. No areas of enhancement meeting 
threshold criteria on CAD analysis. There is a 7 x 5 mm circumscribed area of 
increased T2 signal intensity within the inferior outer left breast 
approximately 3 to 4 cm from the nipple. No abnormal enhancement within this 
region. This could correlate with the ultrasonographic findings and may 
represent a fibroadenoma. Small nonspecific left axillary lymph nodes.  No 
internal mammary nodes. 
Hepatic cysts are suggested, only partially included.
IMPRESSION: No MR findings suggestive for malignancy.  
Recommend follow-up ultrasound exam of the left breast in 6 months time to 
document stability of the hypoechoic lesion seen on the ultrasound exam. 
(BI-RADS 3) Probably benign finding. A short interval follow-up is recommended 
as described above.
